# Patient Record
Sex: Male | Born: 1995 | Race: White | Hispanic: No | Marital: Single | Smoking: Current some day smoker
Health system: Southern US, Community
[De-identification: ages and names within clinical notes are randomized; demographics above are authoritative.]

---
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
## 2017-06-11 ENCOUNTER — Emergency Department (HOSPITAL_COMMUNITY): Payer: 59

## 2017-06-11 ENCOUNTER — Encounter (HOSPITAL_COMMUNITY): Payer: Self-pay | Admitting: *Deleted

## 2017-06-11 ENCOUNTER — Observation Stay (HOSPITAL_COMMUNITY)
Admission: EM | Admit: 2017-06-11 | Discharge: 2017-06-13 | Disposition: A | Discharge status: Home / Self Care | Payer: 59 | Attending: Surgery | Admitting: Surgery

## 2017-06-11 DIAGNOSIS — F17200 Nicotine dependence, unspecified, uncomplicated: Secondary | ICD-10-CM | POA: Diagnosis not present

## 2017-06-11 DIAGNOSIS — K352 Acute appendicitis with generalized peritonitis, without abscess: Secondary | ICD-10-CM

## 2017-06-11 DIAGNOSIS — K3580 Unspecified acute appendicitis: Principal | ICD-10-CM | POA: Insufficient documentation

## 2017-06-11 DIAGNOSIS — K37 Unspecified appendicitis: Secondary | ICD-10-CM | POA: Diagnosis present

## 2017-06-11 LAB — COMPREHENSIVE METABOLIC PANEL
ALBUMIN: 4.7 g/dL (ref 3.5–5.0)
ALT: 57 U/L (ref 17–63)
ANION GAP: 9 (ref 5–15)
AST: 28 U/L (ref 15–41)
Alkaline Phosphatase: 55 U/L (ref 38–126)
BILIRUBIN TOTAL: 1.3 mg/dL — AB (ref 0.3–1.2)
BUN: 10 mg/dL (ref 6–20)
CHLORIDE: 106 mmol/L (ref 101–111)
CO2: 24 mmol/L (ref 22–32)
Calcium: 9.4 mg/dL (ref 8.9–10.3)
Creatinine, Ser: 1.01 mg/dL (ref 0.61–1.24)
GFR calc Af Amer: >60 mL/min (ref >60)
GFR calc non Af Amer: >60 mL/min (ref >60)
GLUCOSE: 96 mg/dL (ref 65–99)
POTASSIUM: 3.7 mmol/L (ref 3.5–5.1)
SODIUM: 139 mmol/L (ref 135–145)
TOTAL PROTEIN: 7.3 g/dL (ref 6.5–8.1)

## 2017-06-11 LAB — URINALYSIS, ROUTINE W REFLEX MICROSCOPIC
BILIRUBIN URINE: NEGATIVE
Glucose, UA: NEGATIVE mg/dL
Hgb urine dipstick: NEGATIVE
Ketones, ur: NEGATIVE mg/dL
Leukocytes, UA: NEGATIVE
NITRITE: NEGATIVE
PH: 5 (ref 5.0–8.0)
Protein, ur: NEGATIVE mg/dL
SPECIFIC GRAVITY, URINE: 1.016 (ref 1.005–1.030)

## 2017-06-11 LAB — CBC
HEMATOCRIT: 41.5 % (ref 39.0–52.0)
HEMOGLOBIN: 15.3 g/dL (ref 13.0–17.0)
MCH: 30.9 pg (ref 26.0–34.0)
MCHC: 36.9 g/dL — AB (ref 30.0–36.0)
MCV: 83.8 fL (ref 78.0–100.0)
Platelets: 256 10*3/uL (ref 150–400)
RBC: 4.95 MIL/uL (ref 4.22–5.81)
RDW: 12 % (ref 11.5–15.5)
WBC: 11.6 10*3/uL — ABNORMAL HIGH (ref 4.0–10.5)

## 2017-06-11 LAB — LIPASE, BLOOD: Lipase: 28 U/L (ref 11–51)

## 2017-06-11 MED ORDER — IOPAMIDOL (ISOVUE-300) INJECTION 61%
INTRAVENOUS | Status: AC
  Administered 2017-06-11: 100 mL via INTRAVENOUS
  Filled 2017-06-11: qty 100

## 2017-06-11 MED ORDER — ONDANSETRON 4 MG PO TBDP: 4.0000 mg | ORAL_TABLET | Freq: Four times a day (QID) | ORAL | Status: DC | PRN

## 2017-06-11 MED ORDER — ENOXAPARIN SODIUM 40 MG/0.4ML ~~LOC~~ SOLN
40.0000 mg | SUBCUTANEOUS | Status: DC
  Administered 2017-06-12: 40 mg via SUBCUTANEOUS
  Filled 2017-06-11: qty 0.4

## 2017-06-11 MED ORDER — MORPHINE SULFATE (PF) 2 MG/ML IV SOLN: 2.0000 mg | INTRAVENOUS | Status: DC | PRN

## 2017-06-11 MED ORDER — KCL IN DEXTROSE-NACL 20-5-0.45 MEQ/L-%-% IV SOLN
INTRAVENOUS | Status: DC
  Administered 2017-06-12: via INTRAVENOUS
  Filled 2017-06-11 (×2): qty 1000

## 2017-06-11 MED ORDER — KETOROLAC TROMETHAMINE 30 MG/ML IJ SOLN
10.0000 mg | Freq: Once | INTRAMUSCULAR | Status: AC
  Administered 2017-06-11: 9.9 mg via INTRAVENOUS
  Filled 2017-06-11: qty 1

## 2017-06-11 MED ORDER — METRONIDAZOLE IN NACL 5-0.79 MG/ML-% IV SOLN
500.0000 mg | Freq: Three times a day (TID) | INTRAVENOUS | Status: DC
  Administered 2017-06-12 (×2): 500 mg via INTRAVENOUS
  Filled 2017-06-11 (×4): qty 100

## 2017-06-11 MED ORDER — ACETAMINOPHEN 325 MG PO TABS: 650.0000 mg | ORAL_TABLET | Freq: Four times a day (QID) | ORAL | Status: DC | PRN

## 2017-06-11 MED ORDER — DEXTROSE 5 % IV SOLN
2.0000 g | INTRAVENOUS | Status: DC
  Administered 2017-06-11: 2 g via INTRAVENOUS
  Filled 2017-06-11: qty 2

## 2017-06-11 MED ORDER — ACETAMINOPHEN 650 MG RE SUPP: 650.0000 mg | Freq: Four times a day (QID) | RECTAL | Status: DC | PRN

## 2017-06-11 MED ORDER — SODIUM CHLORIDE 0.9 % IV BOLUS (SEPSIS)
1000.0000 mL | Freq: Once | INTRAVENOUS | Status: AC
  Administered 2017-06-11: 1000 mL via INTRAVENOUS

## 2017-06-11 MED ORDER — DEXTROSE 5 % IV SOLN
2.0000 g | INTRAVENOUS | Status: DC
  Filled 2017-06-11: qty 2

## 2017-06-11 MED ORDER — ONDANSETRON HCL 4 MG/2ML IJ SOLN: 4.0000 mg | Freq: Four times a day (QID) | INTRAMUSCULAR | Status: DC | PRN

## 2017-06-11 NOTE — ED Triage Notes (Signed)
Pt complains of abdominal pain x 3 days, pain moved primarily into his RLQ today. Pt denies emesis/diarrhea, states he had a small bowel movement yesterday.

## 2017-06-11 NOTE — ED Provider Notes (Signed)
[facility]-EMERGENCY DEPT Provider Note   CSN: [id number] Arrival date & time: 06/11/17  1444     History   Chief Complaint Chief Complaint  Patient presents with  . Abdominal Pain    HPI William GrillRobert Lewelling Jr. is a 21 y.o. male.  HPI  Patient presents with concern of abdominal pain onset was several days ago.  Since onset the pain is migrated from his mid abdomen to the right side of the abdomen. There is associated anorexia, nausea, but no vomiting, no diarrhea.  Pain is sore, severe, crampy without clear alleviating or exacerbating factors No fever, no chills. Patient has no history of medical illness, nor abdominal surgery. Patient is here with his girlfriend corroborates the history.Marland Kitchen.   History reviewed. No pertinent past medical history.  There are no active problems to display for this patient.   History reviewed. No pertinent surgical history.     Home Medications    Prior to Admission medications   Not on File    Family History No family history on file.  Social History Social History  Substance Use Topics  . Smoking status: Never Smoker  . Smokeless tobacco: Current User    Types: Chew  . Alcohol use Yes     Allergies   Patient has no allergy information on record.   Review of Systems Review of Systems  Constitutional:       Per HPI, otherwise negative  HENT:       Per HPI, otherwise negative  Respiratory:       Per HPI, otherwise negative  Cardiovascular:       Per HPI, otherwise negative  Gastrointestinal: Positive for abdominal pain and nausea. Negative for vomiting.  Endocrine:       Negative aside from HPI  Genitourinary:       Neg aside from HPI   Musculoskeletal:       Per HPI, otherwise negative  Skin: Negative.   Neurological: Negative for syncope.     Physical Exam Updated Vital Signs BP (!) 131/94 (BP Location: Left Arm)   Pulse 90   Temp 98.2 F (36.8 C) (Oral)   Resp 18   SpO2 99%   Physical Exam  Constitutional:  He is oriented to person, place, and time. He appears well-developed. No distress.  HENT:  Head: Normocephalic and atraumatic.  Eyes: Conjunctivae and EOM are normal.  Cardiovascular: Normal rate and regular rhythm.   Pulmonary/Chest: Effort normal. No stridor. No respiratory distress.  Abdominal: He exhibits no distension. There is tenderness in the right upper quadrant and right lower quadrant. There is guarding.  Musculoskeletal: He exhibits no edema.  Neurological: He is alert and oriented to person, place, and time.  Skin: Skin is warm and dry.  Psychiatric: He has a normal mood and affect.  Nursing note and vitals reviewed.    ED Treatments / Results  Labs (all labs ordered are listed, but only abnormal results are displayed) Labs Reviewed  COMPREHENSIVE METABOLIC PANEL - Abnormal; Notable for the following:       Result Value   Total Bilirubin 1.3 (*)    All other components within normal limits  CBC - Abnormal; Notable for the following:    WBC 11.6 (*)    MCHC 36.9 (*)    All other components within normal limits  LIPASE, BLOOD  URINALYSIS, ROUTINE W REFLEX MICROSCOPIC    Radiology Ct Abdomen Pelvis W Contrast  Result Date: 06/11/2017 CLINICAL DATA:  Right lower quadrant abdominal pain.  EXAM: CT ABDOMEN AND PELVIS WITH CONTRAST TECHNIQUE: Multidetector CT imaging of the abdomen and pelvis was performed using the standard protocol following bolus administration of intravenous contrast. CONTRAST:  100 cc Isovue-300 IV COMPARISON:  None. FINDINGS: Lower chest: The lung bases are clear. Hepatobiliary: No focal liver abnormality is seen. No gallstones, gallbladder wall thickening, or biliary dilatation. Pancreas: No ductal dilatation or inflammation. Spleen: Mild splenomegaly, spleen measures 15.1 cm craniocaudal. Adrenals/Urinary Tract: Normal adrenal glands. No hydronephrosis or perinephric edema. Homogeneous renal enhancement. Urinary bladder is minimally distended, no bladder  wall thickening. Stomach/Bowel: The appendix is dilated measuring 11 mm, fluid-filled with moderate periappendiceal soft tissue stranding. Trace periappendiceal fluid. No perforation or abscess. Mild wall thickening of the cecum is likely reactive. Small volume of stool in the remainder the colon. No small bowel dilatation, inflammation or obstruction. Vascular/Lymphatic: Prominent ileocecal lymph nodes likely reactive. Abdominal aorta is normal in caliber. The portal vein is patent. Reproductive: Prostate is unremarkable. Other: Minimal free fluid in the right lower quadrant tracks into the pelvis. No intra-abdominal abscess. No free air. Small fat containing umbilical hernia. Musculoskeletal: There are no acute or suspicious osseous abnormalities. Transitional lumbosacral anatomy with 4 non-rib-bearing lumbar vertebra. Well-circumscribed lucency in the right femoral head with central sclerotic focus appears benign but is nonspecific. IMPRESSION: 1. Uncomplicated acute appendicitis. 2. Splenomegaly. Electronically Signed   By: Rubye Oaks M.D.   On: 06/11/2017 20:34    Procedures Procedures (including critical care time)  Medications Ordered in ED Medications  cefTRIAXone (ROCEPHIN) 2 g in dextrose 5 % 50 mL IVPB (2 g Intravenous New Bag/Given 06/11/17 2137)  ketorolac (TORADOL) 30 MG/ML injection 9.9 mg (9.9 mg Intravenous Given 06/11/17 2044)  sodium chloride 0.9 % bolus 1,000 mL (1,000 mLs Intravenous New Bag/Given 06/11/17 2044)  iopamidol (ISOVUE-300) 61 % injection (100 mLs Intravenous Contrast Given 06/11/17 2003)     Initial Impression / Assessment and Plan / ED Course  I have reviewed the triage vital signs and the nursing notes.  Pertinent labs & imaging results that were available during my care of the patient were reviewed by me and considered in my medical decision making (see chart for details).     After CT results were available I repeat evaluated the patient, who remains in  similar condition. She results with him and his girlfriend. Subsequent discussed patient's case with our surgeon. With concern for appendicitis, the patient will require admission for further evaluation and management.   Final Clinical Impressions(s) / ED Diagnoses  Acute appendicitis   Gerhard Munch, MD 06/11/17 2156

## 2017-06-11 NOTE — H&P (Signed)
Johnatan Baskette. is an 21 y.o. male.   Chief Complaint: abd pain HPI: 21 y.o. M with a couple days of vague abd pain that intensified and localized to the right side earlier today.  He has never had this in the past.  He denies fevers, nausea, vomiting or changes in bowel habits.    History reviewed. No pertinent past medical history.  History reviewed. No pertinent surgical history.  No family history on file. Social History:  reports that he has never smoked. His smokeless tobacco use includes Chew. He reports that he drinks alcohol. He reports that he does not use drugs.  Allergies: No Known Allergies   (Not in a hospital admission)  Results for orders placed or performed during the hospital encounter of 06/11/17 (from the past 48 hour(s))  Urinalysis, Routine w reflex microscopic     Status: None   Collection Time: 06/11/17  3:07 PM  Result Value Ref Range   Color, Urine YELLOW YELLOW   APPearance CLEAR CLEAR   Specific Gravity, Urine 1.016 1.005 - 1.030   pH 5.0 5.0 - 8.0   Glucose, UA NEGATIVE NEGATIVE mg/dL   Hgb urine dipstick NEGATIVE NEGATIVE   Bilirubin Urine NEGATIVE NEGATIVE   Ketones, ur NEGATIVE NEGATIVE mg/dL   Protein, ur NEGATIVE NEGATIVE mg/dL   Nitrite NEGATIVE NEGATIVE   Leukocytes, UA NEGATIVE NEGATIVE  Lipase, blood     Status: None   Collection Time: 06/11/17  3:13 PM  Result Value Ref Range   Lipase 28 11 - 51 U/L  Comprehensive metabolic panel     Status: Abnormal   Collection Time: 06/11/17  3:13 PM  Result Value Ref Range   Sodium 139 135 - 145 mmol/L   Potassium 3.7 3.5 - 5.1 mmol/L   Chloride 106 101 - 111 mmol/L   CO2 24 22 - 32 mmol/L   Glucose, Bld 96 65 - 99 mg/dL   BUN 10 6 - 20 mg/dL   Creatinine, Ser 1.01 0.61 - 1.24 mg/dL   Calcium 9.4 8.9 - 10.3 mg/dL   Total Protein 7.3 6.5 - 8.1 g/dL   Albumin 4.7 3.5 - 5.0 g/dL   AST 28 15 - 41 U/L   ALT 57 17 - 63 U/L   Alkaline Phosphatase 55 38 - 126 U/L   Total Bilirubin 1.3 (H) 0.3 -  1.2 mg/dL   GFR calc non Af Amer >60 >60 mL/min   GFR calc Af Amer >60 >60 mL/min    Comment: (NOTE) The eGFR has been calculated using the CKD EPI equation. This calculation has not been validated in all clinical situations. eGFR's persistently <60 mL/min signify possible Chronic Kidney Disease.    Anion gap 9 5 - 15  CBC     Status: Abnormal   Collection Time: 06/11/17  3:13 PM  Result Value Ref Range   WBC 11.6 (H) 4.0 - 10.5 K/uL   RBC 4.95 4.22 - 5.81 MIL/uL   Hemoglobin 15.3 13.0 - 17.0 g/dL   HCT 41.5 39.0 - 52.0 %   MCV 83.8 78.0 - 100.0 fL   MCH 30.9 26.0 - 34.0 pg   MCHC 36.9 (H) 30.0 - 36.0 g/dL   RDW 12.0 11.5 - 15.5 %   Platelets 256 150 - 400 K/uL   Ct Abdomen Pelvis W Contrast  Result Date: 06/11/2017 CLINICAL DATA:  Right lower quadrant abdominal pain. EXAM: CT ABDOMEN AND PELVIS WITH CONTRAST TECHNIQUE: Multidetector CT imaging of the abdomen and pelvis was  performed using the standard protocol following bolus administration of intravenous contrast. CONTRAST:  100 cc Isovue-300 IV COMPARISON:  None. FINDINGS: Lower chest: The lung bases are clear. Hepatobiliary: No focal liver abnormality is seen. No gallstones, gallbladder wall thickening, or biliary dilatation. Pancreas: No ductal dilatation or inflammation. Spleen: Mild splenomegaly, spleen measures 15.1 cm craniocaudal. Adrenals/Urinary Tract: Normal adrenal glands. No hydronephrosis or perinephric edema. Homogeneous renal enhancement. Urinary bladder is minimally distended, no bladder wall thickening. Stomach/Bowel: The appendix is dilated measuring 11 mm, fluid-filled with moderate periappendiceal soft tissue stranding. Trace periappendiceal fluid. No perforation or abscess. Mild wall thickening of the cecum is likely reactive. Small volume of stool in the remainder the colon. No small bowel dilatation, inflammation or obstruction. Vascular/Lymphatic: Prominent ileocecal lymph nodes likely reactive. Abdominal aorta is  normal in caliber. The portal vein is patent. Reproductive: Prostate is unremarkable. Other: Minimal free fluid in the right lower quadrant tracks into the pelvis. No intra-abdominal abscess. No free air. Small fat containing umbilical hernia. Musculoskeletal: There are no acute or suspicious osseous abnormalities. Transitional lumbosacral anatomy with 4 non-rib-bearing lumbar vertebra. Well-circumscribed lucency in the right femoral head with central sclerotic focus appears benign but is nonspecific. IMPRESSION: 1. Uncomplicated acute appendicitis. 2. Splenomegaly. Electronically Signed   By: Jeb Levering M.D.   On: 06/11/2017 20:34    Review of Systems  Constitutional: Negative for chills and fever.  HENT: Negative for hearing loss.   Eyes: Negative for blurred vision and double vision.  Respiratory: Negative for cough and shortness of breath.   Cardiovascular: Negative for chest pain and palpitations.  Gastrointestinal: Positive for abdominal pain. Negative for nausea and vomiting.  Genitourinary: Negative for dysuria and urgency.  Musculoskeletal: Negative for myalgias.  Skin: Negative for itching and rash.    Blood pressure 139/84, pulse 70, temperature 98.2 F (36.8 C), temperature source Oral, resp. rate 18, SpO2 100 %. Physical Exam  Constitutional: He is oriented to person, place, and time. He appears well-developed and well-nourished. No distress.  HENT:  Head: Normocephalic and atraumatic.  Eyes: Pupils are equal, round, and reactive to light. Conjunctivae and EOM are normal.  Cardiovascular: Normal rate and regular rhythm.   Respiratory: Effort normal. No respiratory distress.  GI: Soft. He exhibits no distension. There is tenderness (RLQ).  Musculoskeletal: Normal range of motion.  Neurological: He is alert and oriented to person, place, and time.     Assessment/Plan 21 y.o. M with acute appendicitis.  Will admit to floor.  IV abx.  NPO after midnight for OR in AM.     Rosario Adie., MD 0/12/7942, 10:09 PM

## 2017-06-12 ENCOUNTER — Encounter (HOSPITAL_COMMUNITY): Payer: Self-pay | Admitting: Surgery

## 2017-06-12 ENCOUNTER — Encounter (HOSPITAL_COMMUNITY)
Admission: EM | Disposition: A | Discharge status: Home / Self Care | Payer: Self-pay | Source: Home / Self Care | Attending: Emergency Medicine

## 2017-06-12 ENCOUNTER — Observation Stay (HOSPITAL_COMMUNITY): Payer: 59 | Admitting: Anesthesiology

## 2017-06-12 HISTORY — PX: LAPAROSCOPIC APPENDECTOMY: SHX408

## 2017-06-12 LAB — CBC
HCT: 40.6 % (ref 39.0–52.0)
Hemoglobin: 14.7 g/dL (ref 13.0–17.0)
MCH: 31.4 pg (ref 26.0–34.0)
MCHC: 36.2 g/dL — AB (ref 30.0–36.0)
MCV: 86.8 fL (ref 78.0–100.0)
PLATELETS: 216 10*3/uL (ref 150–400)
RBC: 4.68 MIL/uL (ref 4.22–5.81)
RDW: 12.3 % (ref 11.5–15.5)
WBC: 13.5 10*3/uL — AB (ref 4.0–10.5)

## 2017-06-12 LAB — COMPREHENSIVE METABOLIC PANEL
ALBUMIN: 4 g/dL (ref 3.5–5.0)
ALK PHOS: 48 U/L (ref 38–126)
ALT: 46 U/L (ref 17–63)
ANION GAP: 7 (ref 5–15)
AST: 24 U/L (ref 15–41)
BUN: 9 mg/dL (ref 6–20)
CALCIUM: 8.5 mg/dL — AB (ref 8.9–10.3)
CO2: 26 mmol/L (ref 22–32)
CREATININE: 1.07 mg/dL (ref 0.61–1.24)
Chloride: 107 mmol/L (ref 101–111)
Glucose, Bld: 133 mg/dL — ABNORMAL HIGH (ref 65–99)
Potassium: 3.6 mmol/L (ref 3.5–5.1)
SODIUM: 140 mmol/L (ref 135–145)
Total Bilirubin: 0.6 mg/dL (ref 0.3–1.2)
Total Protein: 6.2 g/dL — ABNORMAL LOW (ref 6.5–8.1)

## 2017-06-12 LAB — CREATININE, SERUM
Creatinine, Ser: 0.87 mg/dL (ref 0.61–1.24)
GFR calc Af Amer: >60 mL/min (ref >60)
GFR calc non Af Amer: >60 mL/min (ref >60)

## 2017-06-12 LAB — SURGICAL PCR SCREEN
MRSA, PCR: NEGATIVE
STAPHYLOCOCCUS AUREUS: NEGATIVE

## 2017-06-12 SURGERY — APPENDECTOMY, LAPAROSCOPIC
Anesthesia: General | Site: Abdomen

## 2017-06-12 MED ORDER — ROCURONIUM BROMIDE 50 MG/5ML IV SOSY
PREFILLED_SYRINGE | INTRAVENOUS | Status: AC
  Filled 2017-06-12: qty 5

## 2017-06-12 MED ORDER — METRONIDAZOLE IN NACL 5-0.79 MG/ML-% IV SOLN: 500.0000 mg | Freq: Three times a day (TID) | INTRAVENOUS | Status: DC

## 2017-06-12 MED ORDER — DIPHENHYDRAMINE HCL 25 MG PO CAPS: 25.0000 mg | ORAL_CAPSULE | Freq: Four times a day (QID) | ORAL | Status: DC | PRN

## 2017-06-12 MED ORDER — DEXAMETHASONE SODIUM PHOSPHATE 10 MG/ML IJ SOLN
INTRAMUSCULAR | Status: DC | PRN
  Administered 2017-06-12: 10 mg via INTRAVENOUS

## 2017-06-12 MED ORDER — LACTATED RINGERS IV SOLN
INTRAVENOUS | Status: DC | PRN
  Administered 2017-06-12: 09:00:00 via INTRAVENOUS

## 2017-06-12 MED ORDER — PROPOFOL 10 MG/ML IV BOLUS
INTRAVENOUS | Status: DC | PRN
  Administered 2017-06-12: 200 mg via INTRAVENOUS
  Administered 2017-06-12: 100 mg via INTRAVENOUS

## 2017-06-12 MED ORDER — 0.9 % SODIUM CHLORIDE (POUR BTL) OPTIME
TOPICAL | Status: DC | PRN
  Administered 2017-06-12: 1000 mL

## 2017-06-12 MED ORDER — FENTANYL CITRATE (PF) 250 MCG/5ML IJ SOLN
INTRAMUSCULAR | Status: AC
  Filled 2017-06-12: qty 5

## 2017-06-12 MED ORDER — DEXTROSE 5 % IV SOLN
2.0000 g | INTRAVENOUS | Status: DC
  Administered 2017-06-12: 22:00:00 2 g via INTRAVENOUS
  Filled 2017-06-12: qty 2

## 2017-06-12 MED ORDER — SUCCINYLCHOLINE CHLORIDE 200 MG/10ML IV SOSY
PREFILLED_SYRINGE | INTRAVENOUS | Status: AC
  Filled 2017-06-12: qty 10

## 2017-06-12 MED ORDER — HYDRALAZINE HCL 20 MG/ML IJ SOLN: 10.0000 mg | INTRAMUSCULAR | Status: DC | PRN

## 2017-06-12 MED ORDER — KCL IN DEXTROSE-NACL 20-5-0.45 MEQ/L-%-% IV SOLN
INTRAVENOUS | Status: DC
  Administered 2017-06-12 – 2017-06-13 (×2): via INTRAVENOUS
  Filled 2017-06-12 (×3): qty 1000

## 2017-06-12 MED ORDER — DIPHENHYDRAMINE HCL 50 MG/ML IJ SOLN: 25.0000 mg | Freq: Four times a day (QID) | INTRAMUSCULAR | Status: DC | PRN

## 2017-06-12 MED ORDER — LACTATED RINGERS IV SOLN: INTRAVENOUS | Status: DC

## 2017-06-12 MED ORDER — GABAPENTIN 300 MG PO CAPS
300.0000 mg | ORAL_CAPSULE | Freq: Two times a day (BID) | ORAL | Status: DC
  Administered 2017-06-12 – 2017-06-13 (×3): 300 mg via ORAL
  Filled 2017-06-12 (×3): qty 1

## 2017-06-12 MED ORDER — ROCURONIUM BROMIDE 10 MG/ML (PF) SYRINGE
PREFILLED_SYRINGE | INTRAVENOUS | Status: DC | PRN
  Administered 2017-06-12: 10 mg via INTRAVENOUS
  Administered 2017-06-12: 50 mg via INTRAVENOUS

## 2017-06-12 MED ORDER — ONDANSETRON HCL 4 MG/2ML IJ SOLN: 4.0000 mg | Freq: Four times a day (QID) | INTRAMUSCULAR | Status: DC | PRN

## 2017-06-12 MED ORDER — BUPIVACAINE LIPOSOME 1.3 % IJ SUSP
20.0000 mL | Freq: Once | INTRAMUSCULAR | Status: DC
  Filled 2017-06-12: qty 20

## 2017-06-12 MED ORDER — MORPHINE SULFATE (PF) 2 MG/ML IV SOLN
1.0000 mg | INTRAVENOUS | Status: DC | PRN
  Administered 2017-06-12 – 2017-06-13 (×4): 1 mg via INTRAVENOUS
  Filled 2017-06-12 (×4): qty 1

## 2017-06-12 MED ORDER — HYDROMORPHONE HCL-NACL 0.5-0.9 MG/ML-% IV SOSY
0.2500 mg | PREFILLED_SYRINGE | INTRAVENOUS | Status: DC | PRN
  Administered 2017-06-12 (×4): 0.5 mg via INTRAVENOUS

## 2017-06-12 MED ORDER — ONDANSETRON 4 MG PO TBDP: 4.0000 mg | ORAL_TABLET | Freq: Four times a day (QID) | ORAL | Status: DC | PRN

## 2017-06-12 MED ORDER — HEPARIN SODIUM (PORCINE) 5000 UNIT/ML IJ SOLN
5000.0000 [IU] | Freq: Three times a day (TID) | INTRAMUSCULAR | Status: DC
  Administered 2017-06-12 – 2017-06-13 (×3): 5000 [IU] via SUBCUTANEOUS
  Filled 2017-06-12 (×3): qty 1

## 2017-06-12 MED ORDER — SUGAMMADEX SODIUM 500 MG/5ML IV SOLN
INTRAVENOUS | Status: AC
  Filled 2017-06-12: qty 5

## 2017-06-12 MED ORDER — LIDOCAINE 2% (20 MG/ML) 5 ML SYRINGE
INTRAMUSCULAR | Status: DC | PRN
  Administered 2017-06-12: 50 mg via INTRAVENOUS

## 2017-06-12 MED ORDER — SUGAMMADEX SODIUM 500 MG/5ML IV SOLN
INTRAVENOUS | Status: DC | PRN
  Administered 2017-06-12: 310 mg via INTRAVENOUS

## 2017-06-12 MED ORDER — DEXAMETHASONE SODIUM PHOSPHATE 10 MG/ML IJ SOLN
INTRAMUSCULAR | Status: AC
  Filled 2017-06-12: qty 1

## 2017-06-12 MED ORDER — MIDAZOLAM HCL 5 MG/5ML IJ SOLN
INTRAMUSCULAR | Status: DC | PRN
  Administered 2017-06-12: 2 mg via INTRAVENOUS

## 2017-06-12 MED ORDER — LIDOCAINE 2% (20 MG/ML) 5 ML SYRINGE
INTRAMUSCULAR | Status: AC
  Filled 2017-06-12: qty 5

## 2017-06-12 MED ORDER — ONDANSETRON HCL 4 MG/2ML IJ SOLN
INTRAMUSCULAR | Status: AC
  Filled 2017-06-12: qty 2

## 2017-06-12 MED ORDER — FENTANYL CITRATE (PF) 100 MCG/2ML IJ SOLN
INTRAMUSCULAR | Status: DC | PRN
Start: 2017-06-12 — End: 2017-06-12
  Administered 2017-06-12 (×3): 50 ug via INTRAVENOUS
  Administered 2017-06-12: 100 ug via INTRAVENOUS

## 2017-06-12 MED ORDER — HYDROMORPHONE HCL-NACL 0.5-0.9 MG/ML-% IV SOSY
PREFILLED_SYRINGE | INTRAVENOUS | Status: AC
  Filled 2017-06-12: qty 4

## 2017-06-12 MED ORDER — PROPOFOL 10 MG/ML IV BOLUS
INTRAVENOUS | Status: AC
  Filled 2017-06-12: qty 20

## 2017-06-12 MED ORDER — LACTATED RINGERS IR SOLN
Status: DC | PRN
  Administered 2017-06-12: 1000 mL

## 2017-06-12 MED ORDER — MIDAZOLAM HCL 2 MG/2ML IJ SOLN
INTRAMUSCULAR | Status: AC
  Filled 2017-06-12: qty 2

## 2017-06-12 MED ORDER — METRONIDAZOLE IN NACL 5-0.79 MG/ML-% IV SOLN
500.0000 mg | Freq: Three times a day (TID) | INTRAVENOUS | Status: DC
  Administered 2017-06-12 – 2017-06-13 (×3): 500 mg via INTRAVENOUS
  Filled 2017-06-12 (×3): qty 100

## 2017-06-12 MED ORDER — SUCCINYLCHOLINE CHLORIDE 200 MG/10ML IV SOSY
PREFILLED_SYRINGE | INTRAVENOUS | Status: DC | PRN
  Administered 2017-06-12: 200 mg via INTRAVENOUS

## 2017-06-12 MED ORDER — BUPIVACAINE LIPOSOME 1.3 % IJ SUSP
INTRAMUSCULAR | Status: DC | PRN
Start: 2017-06-12 — End: 2017-06-12
  Administered 2017-06-12: 20 mL

## 2017-06-12 MED ORDER — ONDANSETRON HCL 4 MG/2ML IJ SOLN
INTRAMUSCULAR | Status: DC | PRN
  Administered 2017-06-12: 4 mg via INTRAVENOUS

## 2017-06-12 MED ORDER — HYDROCODONE-ACETAMINOPHEN 5-325 MG PO TABS
1.0000 | ORAL_TABLET | ORAL | Status: DC | PRN
  Administered 2017-06-12 – 2017-06-13 (×5): 2 via ORAL
  Filled 2017-06-12 (×6): qty 2

## 2017-06-12 SURGICAL SUPPLY — 36 items
APPLIER CLIP ROT 10 11.4 M/L (STAPLE)
CABLE HIGH FREQUENCY MONO STRZ (ELECTRODE) ×3 IMPLANT
CHLORAPREP W/TINT 26ML (MISCELLANEOUS) IMPLANT
CLIP APPLIE ROT 10 11.4 M/L (STAPLE) IMPLANT
COVER SURGICAL LIGHT HANDLE (MISCELLANEOUS) ×3 IMPLANT
CUTTER FLEX LINEAR 45M (STAPLE) ×3 IMPLANT
DECANTER SPIKE VIAL GLASS SM (MISCELLANEOUS) IMPLANT
DERMABOND ADVANCED (GAUZE/BANDAGES/DRESSINGS) ×2
DERMABOND ADVANCED .7 DNX12 (GAUZE/BANDAGES/DRESSINGS) ×1 IMPLANT
DRAPE LAPAROSCOPIC ABDOMINAL (DRAPES) ×3 IMPLANT
ELECT REM PT RETURN 15FT ADLT (MISCELLANEOUS) ×3 IMPLANT
ENDOLOOP SUT PDS II  0 18 (SUTURE)
ENDOLOOP SUT PDS II 0 18 (SUTURE) IMPLANT
GLOVE BIOGEL M 8.0 STRL (GLOVE) ×3 IMPLANT
GOWN STRL REUS W/TWL XL LVL3 (GOWN DISPOSABLE) ×9 IMPLANT
KIT BASIN OR (CUSTOM PROCEDURE TRAY) ×3 IMPLANT
PAD POSITIONING PINK XL (MISCELLANEOUS) ×3 IMPLANT
POUCH RETRIEVAL ECOSAC 10 (ENDOMECHANICALS) ×1 IMPLANT
POUCH RETRIEVAL ECOSAC 10MM (ENDOMECHANICALS) ×2
POUCH SPECIMEN RETRIEVAL 10MM (ENDOMECHANICALS) IMPLANT
RELOAD 45 VASCULAR/THIN (ENDOMECHANICALS) IMPLANT
RELOAD STAPLE TA45 3.5 REG BLU (ENDOMECHANICALS) ×3 IMPLANT
SCISSORS LAP 5X45 EPIX DISP (ENDOMECHANICALS) ×3 IMPLANT
SET IRRIG TUBING LAPAROSCOPIC (IRRIGATION / IRRIGATOR) ×3 IMPLANT
SHEARS HARMONIC ACE PLUS 45CM (MISCELLANEOUS) ×3 IMPLANT
SLEEVE XCEL OPT CAN 5 100 (ENDOMECHANICALS) ×3 IMPLANT
STAPLER VISISTAT 35W (STAPLE) ×3 IMPLANT
SUT MNCRL AB 4-0 PS2 18 (SUTURE) ×3 IMPLANT
TOWEL OR 17X26 10 PK STRL BLUE (TOWEL DISPOSABLE) ×3 IMPLANT
TRAY FOLEY W/METER SILVER 14FR (SET/KITS/TRAYS/PACK) IMPLANT
TRAY FOLEY W/METER SILVER 16FR (SET/KITS/TRAYS/PACK) ×3 IMPLANT
TRAY LAPAROSCOPIC (CUSTOM PROCEDURE TRAY) ×3 IMPLANT
TROCAR BLADELESS OPT 5 100 (ENDOMECHANICALS) ×3 IMPLANT
TROCAR XCEL BLUNT TIP 100MML (ENDOMECHANICALS) ×3 IMPLANT
TROCAR XCEL NON-BLD 11X100MML (ENDOMECHANICALS) IMPLANT
TUBING INSUF HEATED (TUBING) ×3 IMPLANT

## 2017-06-12 NOTE — Op Note (Signed)
Surgeon: Wenda LowMatt Jaedyn Lard, MD, FACS  Asst:  none  Anes:  general  Preop Dx: Acute appendicitis Postop Dx: same  Procedure: Laparoscopic appendectomy Location Surgery: [facility] 1 Complications: None noted  EBL:   minimal cc  Drains: none  Description of Procedure:  The patient was taken to OR 1 .  After anesthesia was administered and the patient was prepped a timeout was performed.  Access achieved through the umbilicus with longitudinal incision down into the umbilicus identifying a small umbilical hernia.  This was opened and a 11 Hassan was inserted.  Following insufflation, two 5 mm ports were placed in the right upper quadrant and the left lower quadrant.  The terminal ileum was noted and the terminal ileum fat pad was located and the cecum and taenia were seen.  The appendix was tucked on itself and was quite stuck.  The base was identified and mobilized and it was transected with the blue load on a 4.5 mm Ethicon stapler.  The mesentery of the appendix was divided with the Harmonic scalpel.  The appendix was placed in a bag and brought out through the umbilical site.  This was closed with two sutures of 0 vicryl.  The appendiceal stump and surrounding inflamed fatty tissue were inspected and irrigated.  No bleeding was noted. The abdomen was decompressed and the skin incison were closed with 4-0 monocryl and Dermabond.    The patient tolerated the procedure well and was taken to the PACU in stable condition.     Matt B. Daphine DeutscherMartin, MD, [facility], [place] [phone]

## 2017-06-12 NOTE — Progress Notes (Signed)
Patient arrived on the unit at approximately 2300. He is alert and verbally responsive. No complaints voiced at this time.

## 2017-06-12 NOTE — Interval H&P Note (Signed)
History and Physical Interval Note:  06/12/2017 7:59 AM  William Grillobert Maslow Jr.  has presented today for surgery, with the diagnosis of appendicitis  The various methods of treatment have been discussed with the patient and family. After consideration of risks, benefits and other options for treatment, the patient has consented to  Procedure(s): APPENDECTOMY LAPAROSCOPIC (N/A) as a surgical intervention .  The patient's history has been reviewed, patient examined, no change in status, stable for surgery.  I have reviewed the patient's chart and labs.  Questions were answered to the patient's satisfaction.     [name] B

## 2017-06-12 NOTE — Anesthesia Procedure Notes (Signed)
Procedure Name: Intubation Date/Time: 06/12/2017 9:12 AM Performed by: Anne Fu Pre-anesthesia Checklist: Patient identified, Emergency Drugs available, Suction available, Patient being monitored and Timeout performed Patient Re-evaluated:Patient Re-evaluated prior to induction Oxygen Delivery Method: Circle system utilized Preoxygenation: Pre-oxygenation with 100% oxygen Induction Type: IV induction Ventilation: Mask ventilation without difficulty Laryngoscope Size: Mac and 4 Grade View: Grade II Tube type: Oral Tube size: 7.5 mm Number of attempts: 1 Airway Equipment and Method: Stylet Placement Confirmation: ETT inserted through vocal cords under direct vision,  positive ETCO2 and breath sounds checked- equal and bilateral Secured at: 23 cm Tube secured with: Tape Dental Injury: Teeth and Oropharynx as per pre-operative assessment

## 2017-06-12 NOTE — Anesthesia Preprocedure Evaluation (Signed)
Anesthesia Evaluation  Patient identified by MRN, date of birth, ID band Patient awake    Reviewed: Allergy & Precautions, NPO status , Patient's Chart, lab work & pertinent test results  Airway Mallampati: II       Dental   Pulmonary neg pulmonary ROS,    breath sounds clear to auscultation       Cardiovascular negative cardio ROS   Rhythm:Regular Rate:Normal     Neuro/Psych    GI/Hepatic Neg liver ROS,   Endo/Other  negative endocrine ROS  Renal/GU negative Renal ROS     Musculoskeletal   Abdominal   Peds  Hematology   Anesthesia Other Findings   Reproductive/Obstetrics                             Anesthesia Physical Anesthesia Plan  ASA: III  Anesthesia Plan: General   Post-op Pain Management:    Induction: Rapid sequence, Intravenous and Cricoid pressure planned  PONV Risk Score and Plan: 2 and Ondansetron and Dexamethasone  Airway Management Planned: Oral ETT  Additional Equipment:   Intra-op Plan:   Post-operative Plan:   Informed Consent: I have reviewed the patients History and Physical, chart, labs and discussed the procedure including the risks, benefits and alternatives for the proposed anesthesia with the patient or authorized representative who has indicated his/her understanding and acceptance.   Dental advisory given  Plan Discussed with: CRNA and Anesthesiologist  Anesthesia Plan Comments:         Anesthesia Quick Evaluation

## 2017-06-12 NOTE — Transfer of Care (Signed)
Immediate Anesthesia Transfer of Care Note  Patient: William GrillRobert Britten Jr.  Procedure(s) Performed: Procedure(s): APPENDECTOMY LAPAROSCOPIC (N/A)  Patient Location: PACU  Anesthesia Type:General  Level of Consciousness:  sedated, patient cooperative and responds to stimulation  Airway & Oxygen Therapy:Patient Spontanous Breathing and Patient connected to face mask oxgen  Post-op Assessment:  Report given to PACU RN and Post -op Vital signs reviewed and stable  Post vital signs:  Reviewed and stable  Last Vitals:  Vitals:   06/12/17 0535 06/12/17 0807  BP: 135/82 117/89  Pulse: 75 72  Resp: 18 18  Temp: 37 C 36.7 C  SpO2: 99% 94%    Complications: No apparent anesthesia complications

## 2017-06-12 NOTE — Anesthesia Postprocedure Evaluation (Signed)
Anesthesia Post Note  Patient: William GrillRobert Vint Jr.  Procedure(s) Performed: Procedure(s) (LRB): APPENDECTOMY LAPAROSCOPIC (N/A)     Patient location during evaluation: PACU Anesthesia Type: General Level of consciousness: awake Pain management: pain level controlled Vital Signs Assessment: post-procedure vital signs reviewed and stable Respiratory status: spontaneous breathing Cardiovascular status: stable Anesthetic complications: no    Last Vitals:  Vitals:   06/12/17 1417 06/12/17 1440  BP: (!) 157/76 135/85  Pulse: 62 67  Resp: 15 14  Temp: 36.6 C 36.5 C  SpO2: 100%     Last Pain:  Vitals:   06/12/17 1800  TempSrc:   PainSc: 8                  Satoya Feeley

## 2017-06-13 LAB — CBC
HEMATOCRIT: 39.8 % (ref 39.0–52.0)
HEMOGLOBIN: 14.2 g/dL (ref 13.0–17.0)
MCH: 31.1 pg (ref 26.0–34.0)
MCHC: 35.7 g/dL (ref 30.0–36.0)
MCV: 87.1 fL (ref 78.0–100.0)
Platelets: 242 10*3/uL (ref 150–400)
RBC: 4.57 MIL/uL (ref 4.22–5.81)
RDW: 12.2 % (ref 11.5–15.5)
WBC: 13.1 10*3/uL — ABNORMAL HIGH (ref 4.0–10.5)

## 2017-06-13 MED ORDER — HYDROCODONE-ACETAMINOPHEN 5-325 MG PO TABS: 1.0000 | ORAL_TABLET | ORAL | 0 refills | Status: DC | PRN

## 2017-06-13 MED ORDER — IBUPROFEN 200 MG PO TABS: 400.0000 mg | ORAL_TABLET | Freq: Four times a day (QID) | ORAL | 0 refills | Status: DC | PRN

## 2017-06-13 NOTE — Discharge Instructions (Signed)

## 2017-06-13 NOTE — Discharge Summary (Addendum)
[facility] Discharge Summary   Patient ID: William Grillobert Mims Jr. MRN: [id number] DOB/AGE: 1996-09-22 21 y.o.  Admit date: 06/11/2017 Discharge date: 06/13/2017  Discharge Diagnosis Patient Active Problem List   Diagnosis Date Noted  . Appendicitis 06/11/2017   Imaging: Ct Abdomen Pelvis W Contrast  Result Date: 06/11/2017 CLINICAL DATA:  Right lower quadrant abdominal pain. EXAM: CT ABDOMEN AND PELVIS WITH CONTRAST TECHNIQUE: Multidetector CT imaging of the abdomen and pelvis was performed using the standard protocol following bolus administration of intravenous contrast. CONTRAST:  100 cc Isovue-300 IV COMPARISON:  None. FINDINGS: Lower chest: The lung bases are clear. Hepatobiliary: No focal liver abnormality is seen. No gallstones, gallbladder wall thickening, or biliary dilatation. Pancreas: No ductal dilatation or inflammation. Spleen: Mild splenomegaly, spleen measures 15.1 cm craniocaudal. Adrenals/Urinary Tract: Normal adrenal glands. No hydronephrosis or perinephric edema. Homogeneous renal enhancement. Urinary bladder is minimally distended, no bladder wall thickening. Stomach/Bowel: The appendix is dilated measuring 11 mm, fluid-filled with moderate periappendiceal soft tissue stranding. Trace periappendiceal fluid. No perforation or abscess. Mild wall thickening of the cecum is likely reactive. Small volume of stool in the remainder the colon. No small bowel dilatation, inflammation or obstruction. Vascular/Lymphatic: Prominent ileocecal lymph nodes likely reactive. Abdominal aorta is normal in caliber. The portal vein is patent. Reproductive: Prostate is unremarkable. Other: Minimal free fluid in the right lower quadrant tracks into the pelvis. No intra-abdominal abscess. No free air. Small fat containing umbilical hernia. Musculoskeletal: There are no acute or suspicious osseous abnormalities. Transitional lumbosacral anatomy with 4 non-rib-bearing lumbar vertebra.  Well-circumscribed lucency in the right femoral head with central sclerotic focus appears benign but is nonspecific. IMPRESSION: 1. Uncomplicated acute appendicitis. 2. Splenomegaly. Electronically Signed   By: Rubye OaksMelanie  Ehinger M.D.   On: 06/11/2017 20:34    Procedures Dr. Luretha MurphyMatthew Sherby Moncayo (06/12/17) - Laparoscopic Appendectomy  Hospital Course:  21 y.o. Otherwise healthy male who presented with 24 hours of abdominal pain. ED workup significant for CT scan above consistent with acute uncomplicated appendicitis and leukocytosis of 13,500. He was admitted and underwent laparoscopic appendectomy. On POD#1 his vitals were stable, pain controlled, tolerating PO, and stable for discharge. He will follow up in our office as below.  Physical Exam: General:  Alert, NAD, pleasant, comfortable Abd:  Soft, ND, appropriately tender, incisions C/D/I without active drainage or erythema   Allergies as of 06/13/2017   No Known Allergies     Medication List    TAKE these medications   HYDROcodone-acetaminophen 5-325 MG tablet Commonly known as:  NORCO/VICODIN Take 1 tablet by mouth every 4 (four) hours as needed for moderate pain.   ibuprofen 200 MG tablet Commonly known as:  ADVIL,MOTRIN Take 2 tablets (400 mg total) by mouth every 6 (six) hours as needed for mild pain or moderate pain. What changed:  how much to take  when to take this            Discharge Care Instructions        Start     Ordered   06/13/17 0000  HYDROcodone-acetaminophen (NORCO/VICODIN) 5-325 MG tablet  Every 4 hours PRN    Question:  Supervising Provider  Answer:  Luretha MurphyMARTIN, Davina Howlett   06/13/17 0859   06/13/17 0000  Discharge patient    Question Answer Comment  Discharge disposition 01-Home or Self Care   Discharge patient date 06/13/2017      06/13/17 0859   06/13/17 0000  ibuprofen (ADVIL,MOTRIN) 200 MG tablet  Every 6 hours PRN  06/13/17 0859       Follow-up Information    [facility] Go  on 06/27/2017.   Specialty:  General Surgery Why:  at 10:00 AM for post-operative follow up. please arrive 30 minutes early to get checked in and fill out any necesaary paperwork.  Contact information: [address] [place] [address] [phone]          Signed: Hosie Spangle, [facility] 06/13/2017, 3:28 PM Pager: [phone] Consults: [phone] Mon-Fri 7:00 am-4:30 pm Sat-Sun 7:00 am-11:30 am

## 2017-06-13 NOTE — Progress Notes (Signed)
Reviewed Discharge instructions, prescriptions and answered all questions. IV removed. Patient called family for pick up. Will continue to monitor.

## 2018-12-16 IMAGING — CT CT ABD-PELV W/ CM
2 of 4 series · 16 of 46 positions shown, 18 images · IV contrast (ISOVUE)
Comparison: None.

CLINICAL DATA: Right lower quadrant abdominal pain.

EXAM:
CT ABDOMEN AND PELVIS WITH CONTRAST
TECHNIQUE: Multidetector CT imaging of the abdomen and pelvis was performed
using the standard protocol following bolus administration of
intravenous contrast.
CONTRAST:  100 cc Ksovue-FCC IV

[Series 2: abd/pel with · axial · 0.98mm/px · z∈[+830,+1310]mm · 13 of 108 slices shown, 15 images]
[im 6/108  soft-tissue]
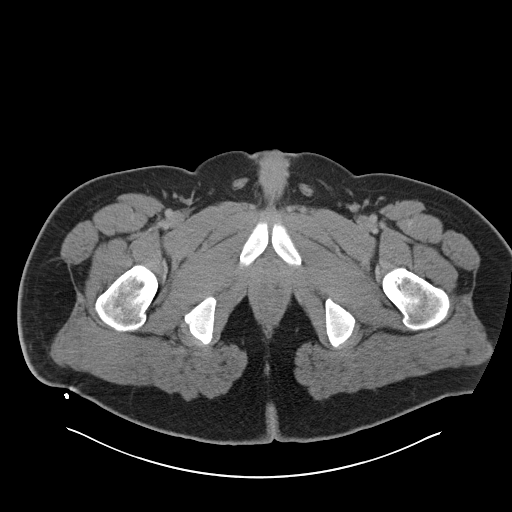
[im 6/108  bone]
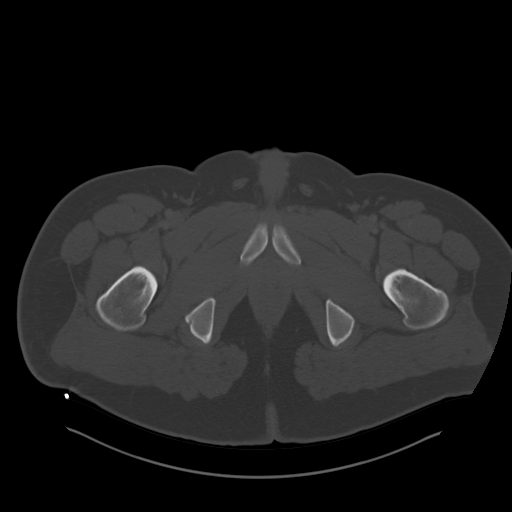
[im 12/108  soft-tissue]
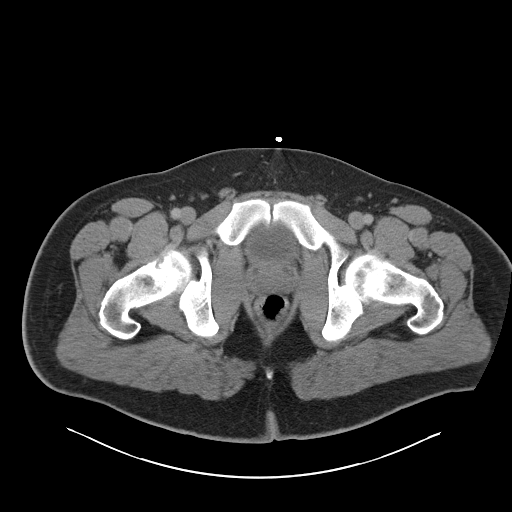
[im 24/108  soft-tissue]
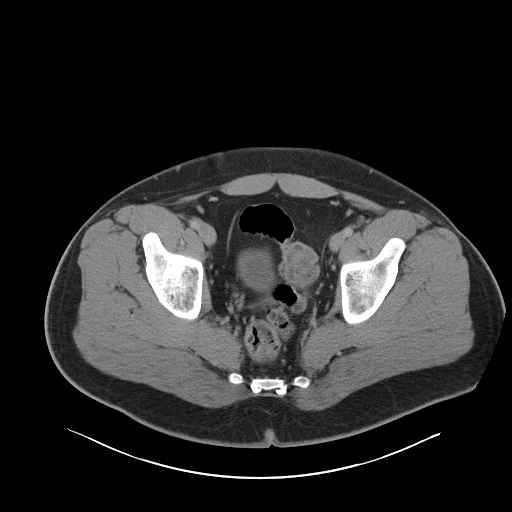
[im 30/108  soft-tissue]
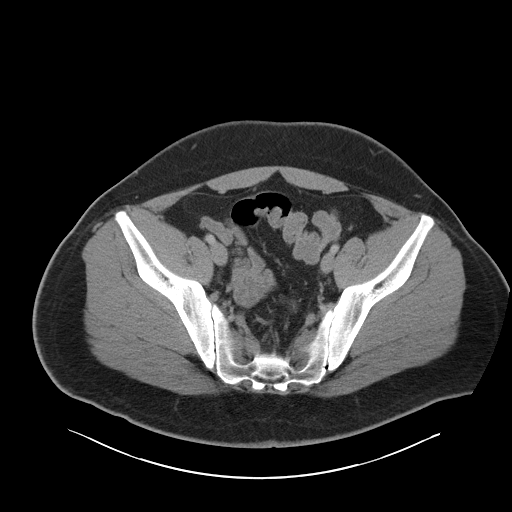
[im 36/108  soft-tissue]
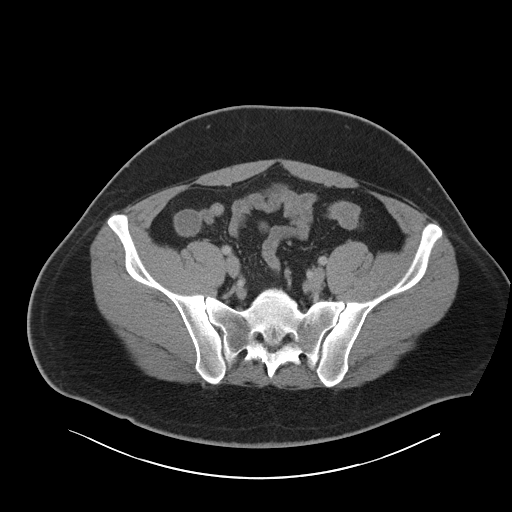
[im 48/108  soft-tissue]
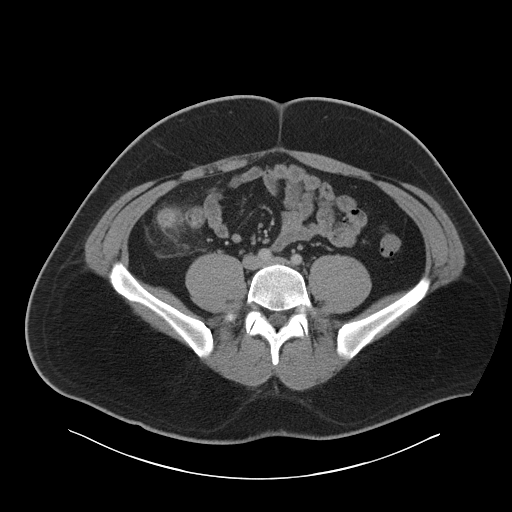
[im 54/108  soft-tissue]
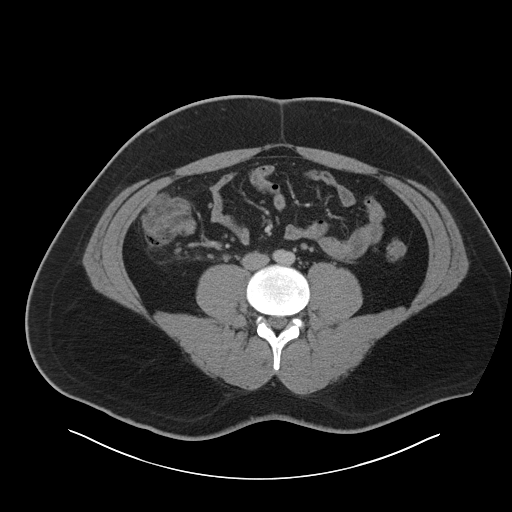
[im 60/108  soft-tissue]
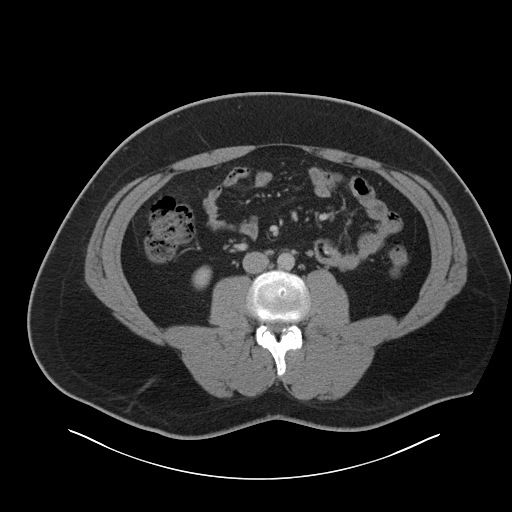
[im 72/108  soft-tissue]
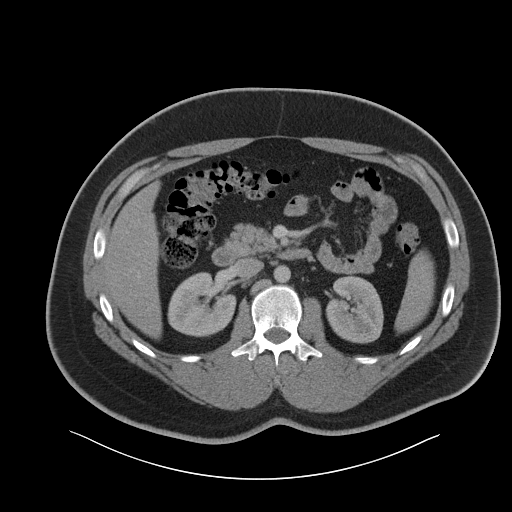
[im 72/108  bone]
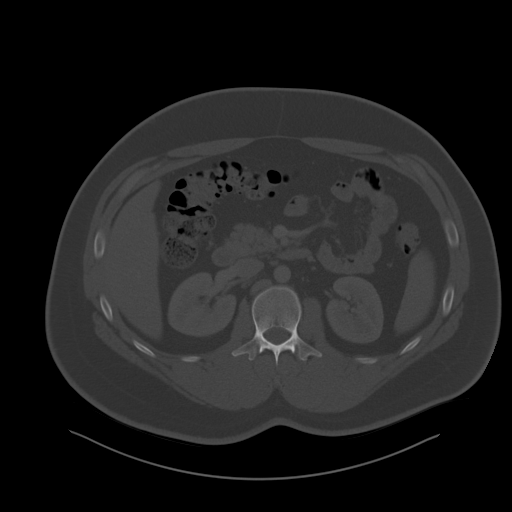
[im 78/108  soft-tissue]
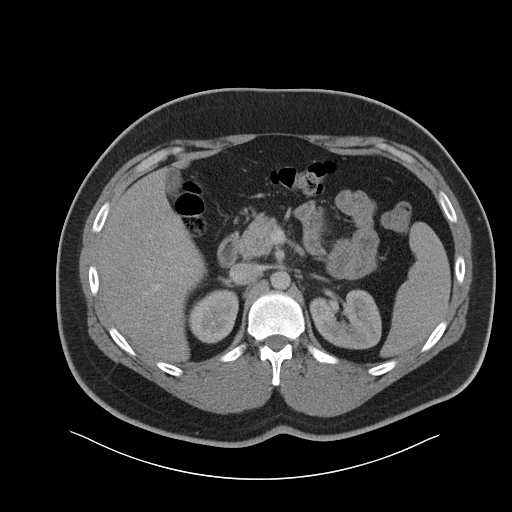
[im 84/108  soft-tissue]
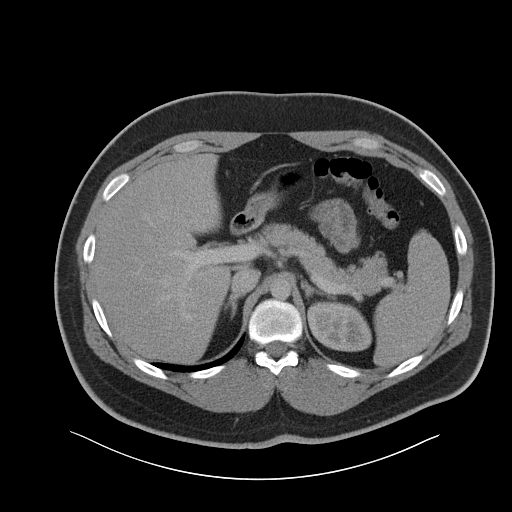
[im 96/108  soft-tissue]
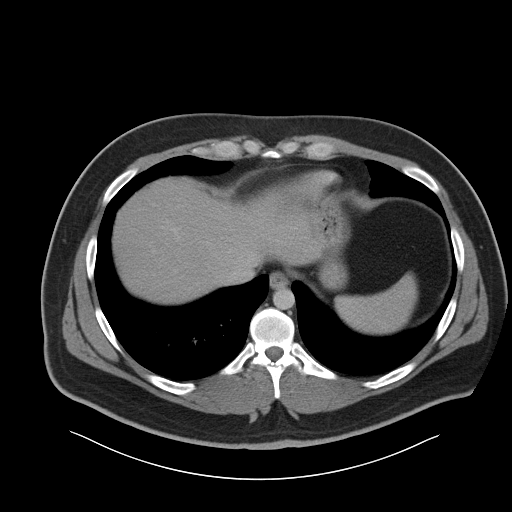
[im 102/108  soft-tissue]
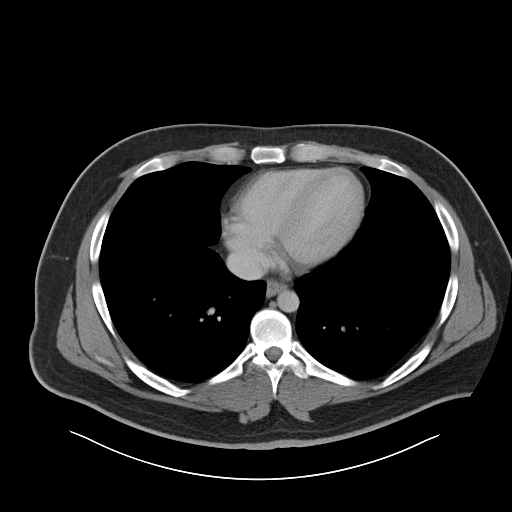

[Series 3: coronal a/|p · coronal · 0.94mm/px · 3 of 191 slices shown]
[im 64/191  soft-tissue]
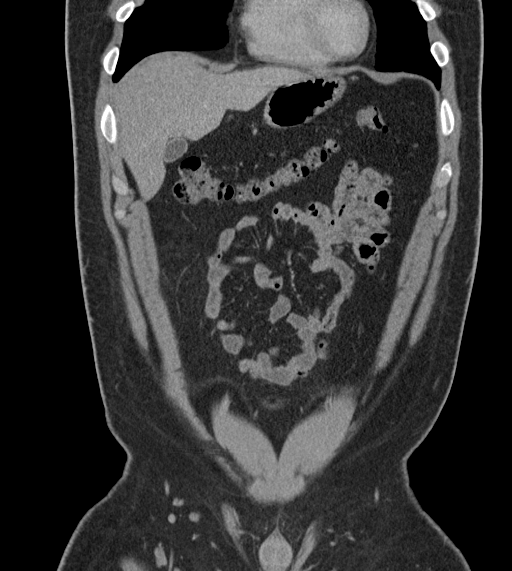
[im 85/191  soft-tissue]
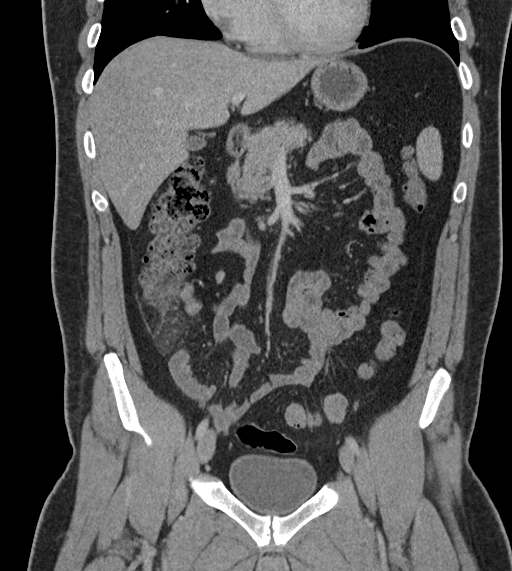
[im 106/191  soft-tissue]
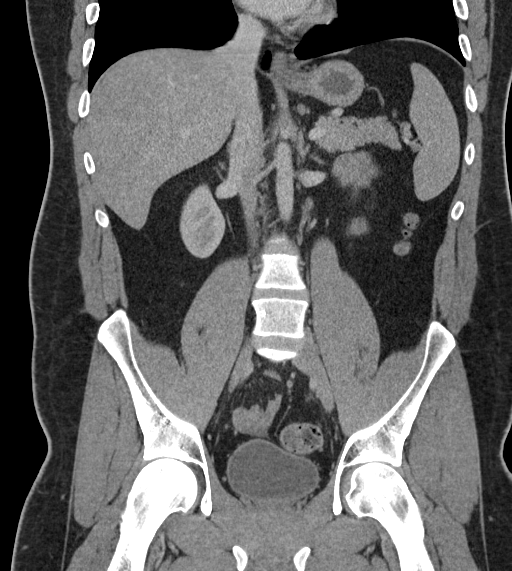

[16 of 46 positions shown; findings below may reference images not displayed]

FINDINGS: Lower chest: The lung bases are clear.

Hepatobiliary: No focal liver abnormality is seen. No gallstones,
gallbladder wall thickening, or biliary dilatation.

Pancreas: No ductal dilatation or inflammation.

Spleen: Mild splenomegaly, spleen measures 15.1 cm craniocaudal.

Adrenals/Urinary Tract: Normal adrenal glands. No hydronephrosis or
perinephric edema. Homogeneous renal enhancement. Urinary bladder is
minimally distended, no bladder wall thickening.

Stomach/Bowel: The appendix is dilated measuring 11 mm, fluid-filled
with moderate periappendiceal soft tissue stranding. Trace
periappendiceal fluid. No perforation or abscess. Mild wall
thickening of the cecum is likely reactive. Small volume of stool in
the remainder the colon. No small bowel dilatation, inflammation or
obstruction.

Vascular/Lymphatic: Prominent ileocecal lymph nodes likely reactive.
Abdominal aorta is normal in caliber. The portal vein is patent.

Reproductive: Prostate is unremarkable.

Other: Minimal free fluid in the right lower quadrant tracks into
the pelvis. No intra-abdominal abscess. No free air. Small fat
containing umbilical hernia.

Musculoskeletal: There are no acute or suspicious osseous
abnormalities. Transitional lumbosacral anatomy with 4
non-rib-bearing lumbar vertebra. Well-circumscribed lucency in the
right femoral head with central sclerotic focus appears benign but
is nonspecific.
IMPRESSION: 1. Uncomplicated acute appendicitis.
2. Splenomegaly.

## 2019-11-11 ENCOUNTER — Ambulatory Visit: Payer: 59 | Attending: Internal Medicine

## 2019-11-11 ENCOUNTER — Ambulatory Visit: Payer: 59

## 2019-11-11 DIAGNOSIS — Z20822 Contact with and (suspected) exposure to covid-19: Secondary | ICD-10-CM

## 2019-11-12 LAB — NOVEL CORONAVIRUS, NAA: SARS-CoV-2, NAA: DETECTED — AB

## 2019-11-13 ENCOUNTER — Telehealth: Payer: Self-pay | Admitting: Infectious Diseases

## 2019-11-13 NOTE — Telephone Encounter (Signed)
Called to discuss with patient about Covid symptoms and the use of bamlanivimab, a monoclonal antibody infusion for those with mild to moderate Covid symptoms and at a high risk of hospitalization.  Pt is qualified for this infusion at the [facility] infusion center due to BMI>35 (340 lb documented in 2018 - need to clarify current status).    Message left to call back

## 2021-03-17 ENCOUNTER — Institutional Professional Consult (permissible substitution): Payer: 59 | Admitting: Neurology

## 2021-05-05 ENCOUNTER — Other Ambulatory Visit: Payer: Self-pay

## 2021-05-05 ENCOUNTER — Ambulatory Visit: Payer: 59 | Admitting: Neurology

## 2021-05-05 ENCOUNTER — Encounter: Payer: Self-pay | Admitting: Neurology

## 2021-05-05 VITALS — BP 141/91 | HR 69 | Ht >= 80 in | Wt 335.0 lb

## 2021-05-05 DIAGNOSIS — Z820 Family history of epilepsy and other diseases of the nervous system: Secondary | ICD-10-CM

## 2021-05-05 DIAGNOSIS — R0683 Snoring: Secondary | ICD-10-CM

## 2021-05-05 DIAGNOSIS — R030 Elevated blood-pressure reading, without diagnosis of hypertension: Secondary | ICD-10-CM

## 2021-05-05 DIAGNOSIS — Z9189 Other specified personal risk factors, not elsewhere classified: Secondary | ICD-10-CM | POA: Diagnosis not present

## 2021-05-05 DIAGNOSIS — E669 Obesity, unspecified: Secondary | ICD-10-CM | POA: Diagnosis not present

## 2021-05-05 NOTE — Patient Instructions (Signed)

## 2021-05-05 NOTE — Progress Notes (Signed)
Subjective:    Patient ID: William Roth. is a 25 y.o. male.  HPI    William Foley, William Roth, William Roth [facility] [address] [address] [place], [place] [address]  Dear Dr. Thornell Mule,  I saw your patient, William Roth, upon your kind request in my sleep clinic today for initial consultation of his sleep disorder, in particular, concern for underlying obstructive sleep apnea.  The patient is unaccompanied today.  As you know, William Roth is a 25 year old right-handed gentleman with an underlying medical history of borderline hypertension, and obesity, who reports snoring and a family history of sleep apnea.  I reviewed your office note from 01/20/2021.  His Epworth sleepiness score is 1 out of 24, fatigue severity score is 19 out of 63.  He is married and lives with his wife, snoring can be disturbing to her.  He goes to bed late, does not typically achieve 7 or 8 hours of sleep.  He is in bed typically between 11 PM and 1 AM.  Rise time is between 6:30 AM and 7:30 AM.  He has no children, 1 dog and 1 cat in the household and they typically sleep in the bedroom with them.  He denies recurrent morning headaches or night to night nocturia.  His father has a CPAP machine.  The patient occasionally smokes a cigarette and dips tobacco daily.  He drinks caffeine in the form of soda occasionally, not daily.  He is working on weight loss.  His maximum weight was around 400 pounds in 2019. He drinks alcohol occasionally or rarely, maybe once or twice a month.  He had a tonsillectomy as a child.  He works in [employer] as a Associate Professor.  Blood pressure initially in our office was elevated.  In your office, his blood pressure was 132/82 in April 2022.  His Past Medical History Is Significant For: History reviewed. No pertinent past medical history.  His Past Surgical History Is Significant For: Past Surgical History:  Procedure Laterality Date   ANKLE SURGERY Left 2007    LAPAROSCOPIC APPENDECTOMY N/A 06/12/2017   Procedure: APPENDECTOMY LAPAROSCOPIC;  Surgeon: Luretha Murphy, William Roth;  Location: [facility];  Service: General;  Laterality: N/A;    His Family History Is Significant For: Family History  Problem Relation Age of Onset   Sleep apnea Father    ADD / ADHD Brother    Leukemia Maternal Grandfather    Diabetes Paternal Grandfather     His Social History Is Significant For: Social History   Socioeconomic History   Marital status: Single    Spouse name: Not on file   Number of children: Not on file   Years of education: Not on file   Highest education level: Not on file  Occupational History   Not on file  Tobacco Use   Smoking status: Some Days    Types: Cigarettes   Smokeless tobacco: Current    Types: Chew   Tobacco comments:    Smokes cigarettes while drinking once maybe twice a month  Vaping Use   Vaping Use: Some days  Substance and Sexual Activity   Alcohol use: Yes    Comment: socially 1-2 times per month "maybe"   Drug use: No    Comment: smoked weed in college   Sexual activity: Not on file  Other Topics Concern   Not on file  Social History Narrative   Lives at home with wife and pets   Right handed  Caffeine: 1-2 soft drinks a week   Social Determinants of Health   Financial Resource Strain: Not on file  Food Insecurity: Not on file  Transportation Needs: Not on file  Physical Activity: Not on file  Stress: Not on file  Social Connections: Not on file    His Allergies Are:  No Known Allergies:   His Current Medications Are:  Outpatient Encounter Medications as of 05/05/2021  Medication Sig   [DISCONTINUED] HYDROcodone-acetaminophen (NORCO/VICODIN) 5-325 MG tablet Take 1 tablet by mouth every 4 (four) hours as needed for moderate pain. (Patient not taking: Reported on 05/05/2021)   [DISCONTINUED] ibuprofen (ADVIL,MOTRIN) 200 MG tablet Take 2 tablets (400 mg total) by mouth every 6 (six) hours as needed for mild pain  or moderate pain. (Patient not taking: Reported on 05/05/2021)   No facility-administered encounter medications on file as of 05/05/2021.  :   Review of Systems:  Out of a complete 14 point review of systems, all are reviewed and negative with the exception of these symptoms as listed below:  Review of Systems  Neurological:        Patient is here for a sleep consult. Patient endorses snoring sometimes. He denies daytime sleepiness. He reports he gets abouts 5-6 hours of sleep at night. Hard to get out of bed but once he is up he has no problem and doesn't require coffee to help him wake up. His primary care referred him here for further evaluation.  Epworth Sleepiness Scale 0= would never doze 1= slight chance of dozing 2= moderate chance of dozing 3= high chance of dozing  Sitting and reading: 0 Watching TV: 0 Sitting inactive in a public place (ex. Theater or meeting): 0 As a passenger in a car for an hour without a break: 0 Lying down to rest in the afternoon: 1 Sitting and talking to someone: 0 Sitting quietly after lunch (no alcohol): 0 In a car, while stopped in traffic: 0 Total: 1  FSS 19    Objective:  Neurological Exam  Physical Exam Physical Examination:   Vitals:   05/05/21 0921  BP: (!) 155/103  Pulse: 69   Repeat blood pressure was 141/91 towards the end of the visit.  General Examination: The patient is a very pleasant 25 y.o. male in no acute distress. He appears well-developed and well-nourished and well groomed.   HEENT: Normocephalic, atraumatic, pupils are equal, round and reactive to light, extraocular tracking is good without limitation to gaze excursion or nystagmus noted. Hearing is grossly intact. Face is symmetric with normal facial animation. Speech is clear with no dysarthria noted. There is no hypophonia. There is no lip, neck/head, jaw or voice tremor. Neck is supple with full range of passive and active motion. There are no carotid bruits on  auscultation. Oropharynx exam reveals: mild mouth dryness, good dental hygiene and mild airway crowding, due to prominent uvula.  Tonsils absent.  Mallampati class I.  Neck circumference of 18 and three-quarter inches.  Tongue protrudes centrally and palate elevates symmetrically.  He has a minimal overbite.  Nasal inspection reveals no swelling, no significant deviated septum, no significant inferior turbinate hypertrophy.    Chest: Clear to auscultation without wheezing, rhonchi or crackles noted.  Heart: S1+S2+0, regular and normal without murmurs, rubs or gallops noted.   Abdomen: Soft, non-tender and non-distended with normal bowel sounds appreciated on auscultation.  Extremities: There is no obvious edema.     Skin: Warm and dry without trophic changes noted.  Musculoskeletal: exam reveals no obvious joint deformities.   Neurologically:  Mental status: The patient is awake, alert and oriented in all 4 spheres. His immediate and remote memory, attention, language skills and fund of knowledge are appropriate. There is no evidence of aphasia, agnosia, apraxia or anomia. Speech is clear with normal prosody and enunciation. Thought process is linear. Mood is normal and affect is normal.  Cranial nerves II - XII are as described above under HEENT exam.  Motor exam: Normal bulk, strength and tone is noted. There is no tremor, Romberg is negative. Fine motor skills and coordination: grossly intact.  Cerebellar testing: No dysmetria or intention tremor. There is no truncal or gait ataxia.  Sensory exam: intact to light touch in the upper and lower extremities.  Gait, station and balance: He stands easily. No veering to one side is noted. No leaning to one side is noted. Posture is age-appropriate with left shoulder slightly higher than right. Gait shows normal stride length and normal pace. No problems turning are noted.  Assessment and plan:  In summary, William Roth. is a very pleasant 25  y.o.-year old male with an underlying medical history of borderline hypertension, and obesity, whose history and physical exam are concerning for obstructive sleep apnea (OSA). I had a long chat with the patient about my findings and the diagnosis of OSA, its prognosis and treatment options. We talked about medical treatments, surgical interventions and non-pharmacological approaches. I explained in particular the risks and ramifications of untreated moderate to severe OSA, especially with respect to developing cardiovascular disease down the Road, including congestive heart failure, difficult to treat hypertension, cardiac arrhythmias, or stroke. Even type 2 diabetes has, in part, been linked to untreated OSA. Symptoms of untreated OSA include daytime sleepiness, memory problems, mood irritability and mood disorder such as depression and anxiety, lack of energy, as well as recurrent headaches, especially morning headaches. We talked about nicotine and tobacco product cessation and trying to maintain a healthy lifestyle in general, as well as the importance of weight control. We also talked about the importance of good sleep hygiene. I recommended the following at this time: sleep study.  I explained to him the difference between a laboratory attended sleep study versus home sleep test.  We talked about possible surgical and non-surgical treatment options of OSA, including the use of a custom-made dental device (which would require a referral to a specialist dentist or oral surgeon), upper airway surgical options, such as traditional UPPP or a novel less invasive surgical option in the form of Inspire hypoglossal nerve stimulation (which would involve a referral to an ENT surgeon). I also explained the CPAP treatment option to the patient, who indicated that he would be willing to try CPAP if the need arises.  We will pick up our discussion after testing.  I plan to see him back after testing.  I answered all his  questions today and the patient was in agreement.  Thank you very much for allowing me to participate in the care of this nice patient. If I can be of any further assistance to you please do not hesitate to call me at [phone].  Sincerely,   William Foley, William Roth, William Roth

## 2021-05-31 ENCOUNTER — Ambulatory Visit (INDEPENDENT_AMBULATORY_CARE_PROVIDER_SITE_OTHER): Payer: 59 | Admitting: Neurology

## 2021-05-31 DIAGNOSIS — Z820 Family history of epilepsy and other diseases of the nervous system: Secondary | ICD-10-CM

## 2021-05-31 DIAGNOSIS — G4733 Obstructive sleep apnea (adult) (pediatric): Secondary | ICD-10-CM

## 2021-05-31 DIAGNOSIS — Z9189 Other specified personal risk factors, not elsewhere classified: Secondary | ICD-10-CM

## 2021-05-31 DIAGNOSIS — R0683 Snoring: Secondary | ICD-10-CM

## 2021-05-31 DIAGNOSIS — R030 Elevated blood-pressure reading, without diagnosis of hypertension: Secondary | ICD-10-CM

## 2021-05-31 DIAGNOSIS — E669 Obesity, unspecified: Secondary | ICD-10-CM

## 2021-06-10 NOTE — Progress Notes (Signed)
See procedure note.

## 2021-06-10 NOTE — Procedures (Signed)
   [facility]  HOME SLEEP TEST (Watch PAT) REPORT  STUDY DATE: 06/01/2021  DOB: 12-11-1995  MRN: [id number]  ORDERING CLINICIAN: Huston Foley, MD, PhD   REFERRING CLINICIAN: Charlane Ferretti, DO   CLINICAL INFORMATION/HISTORY: 25 year old right-handed gentleman with an underlying medical history of borderline hypertension, and obesity, who reports snoring and a family history of sleep apnea.   Epworth sleepiness score: 1/24.  BMI: 35.8 kg/m  FINDINGS:   Sleep Summary:   Total Recording Time (hours, min): 6 hours, 43 minutes  Total Sleep Time (hours, min):  5 hours, 47 minutes   Percent REM (%):    16.8%   Respiratory Indices:   Calculated pAHI (per hour):  2.7/hour         REM pAHI:    4.1/hour       NREM pAHI: 2.4/hour  Oxygen Saturation Statistics:    Oxygen Saturation (%) Mean: 95%   Minimum oxygen saturation (%):                 91%   O2 Saturation Range (%): 91-99%    O2 Saturation (minutes) <=88%: 0 min  Pulse Rate Statistics:   Pulse Mean (bpm):    63/min    Pulse Range (41-107/min)   IMPRESSION: Primary snoring   RECOMMENDATION:  This home sleep test does not demonstrate any significant obstructive or central sleep disordered breathing.  The patient has a history of snoring.  Weight loss is recommended which can alleviate the snoring. Other causes of the patient's symptoms, including circadian rhythm disturbances, an underlying mood disorder, medication effect and/or an underlying medical problem cannot be ruled out based on this test. Clinical correlation is recommended. The patient should be cautioned not to drive, work at heights, or operate dangerous or heavy equipment when tired or sleepy. Review and reiteration of good sleep hygiene measures should be pursued with any patient. The patient can follow up with his referring provider, who will be notified of the test results.  I certify that I have reviewed the raw data recording  prior to the issuance of this report in accordance with the standards of the American Academy of Sleep Medicine (AASM).  INTERPRETING PHYSICIAN:   Huston Foley, MD, PhD  Board Certified in Neurology and Sleep Medicine  [facility] [address] [place], [place] [address] [phone]

## 2021-06-14 ENCOUNTER — Telehealth: Payer: Self-pay | Admitting: *Deleted

## 2021-06-14 NOTE — Telephone Encounter (Signed)
Spoke with patient and discussed that his recent home sleep test did not show any significant obstructive sleep apnea.  Patient aware that weight loss is recommended to help with the history of snoring.  Treatment with AutoPap or CPAP machine is not indicated.  Patient can follow-up with his primary care physician. He verbalized understanding. And stated he will follow-up with primary care within the next month or so.  Results routed to Dr. Thornell Mule.

## 2021-06-14 NOTE — Telephone Encounter (Signed)
-----   Message from Huston Foley, MD sent at 06/10/2021  4:02 PM EDT ----- Patient referred by Dr. Thornell Mule, seen by me on 05/05/21, HST on 06/01/21.   Please call and notify the patient that the recent home sleep test did not show any significant obstructive sleep apnea.  For his history of snoring, we do not usually recommend weight loss.  Treatment with a CPAP or AutoPap machine is not indicated.  He can follow-up with his primary care physician at this point.   Thanks,  Huston Foley, MD, PhD [facility])
